# Patient Record
Sex: Male | Born: 1950 | Race: Black or African American | Hispanic: No | Marital: Married | Smoking: Former smoker
Health system: Southern US, Community
[De-identification: ages and names within clinical notes are randomized; demographics above are authoritative.]

## PROBLEM LIST (undated history)

## (undated) DIAGNOSIS — I10 Essential (primary) hypertension: Secondary | ICD-10-CM

---

## 1998-06-20 ENCOUNTER — Emergency Department (HOSPITAL_COMMUNITY): Admission: EM | Admit: 1998-06-20 | Discharge: 1998-06-20 | Payer: Self-pay | Admitting: Emergency Medicine

## 1998-06-23 ENCOUNTER — Emergency Department (HOSPITAL_COMMUNITY): Admission: EM | Admit: 1998-06-23 | Discharge: 1998-06-23 | Payer: Self-pay | Admitting: Emergency Medicine

## 2001-02-11 ENCOUNTER — Encounter: Admission: RE | Admit: 2001-02-11 | Discharge: 2001-02-11 | Payer: Self-pay | Admitting: Internal Medicine

## 2014-04-09 ENCOUNTER — Emergency Department (HOSPITAL_COMMUNITY): Payer: 59

## 2014-04-09 ENCOUNTER — Emergency Department (HOSPITAL_COMMUNITY)
Admission: EM | Admit: 2014-04-09 | Discharge: 2014-04-09 | Disposition: A | Discharge status: Home / Self Care | Payer: 59 | Attending: Emergency Medicine | Admitting: Emergency Medicine

## 2014-04-09 ENCOUNTER — Encounter (HOSPITAL_COMMUNITY): Payer: Self-pay | Admitting: Emergency Medicine

## 2014-04-09 DIAGNOSIS — Y92410 Unspecified street and highway as the place of occurrence of the external cause: Secondary | ICD-10-CM | POA: Diagnosis not present

## 2014-04-09 DIAGNOSIS — IMO0002 Reserved for concepts with insufficient information to code with codable children: Secondary | ICD-10-CM | POA: Diagnosis not present

## 2014-04-09 DIAGNOSIS — S0990XA Unspecified injury of head, initial encounter: Secondary | ICD-10-CM | POA: Insufficient documentation

## 2014-04-09 DIAGNOSIS — Z87891 Personal history of nicotine dependence: Secondary | ICD-10-CM | POA: Diagnosis not present

## 2014-04-09 DIAGNOSIS — I158 Other secondary hypertension: Secondary | ICD-10-CM | POA: Diagnosis not present

## 2014-04-09 DIAGNOSIS — Y9389 Activity, other specified: Secondary | ICD-10-CM | POA: Insufficient documentation

## 2014-04-09 DIAGNOSIS — I498 Other specified cardiac arrhythmias: Secondary | ICD-10-CM | POA: Insufficient documentation

## 2014-04-09 LAB — I-STAT CHEM 8, ED
BUN: 15 mg/dL (ref 6–23)
CHLORIDE: 101 meq/L (ref 96–112)
Calcium, Ion: 1.26 mmol/L (ref 1.13–1.30)
Creatinine, Ser: 1 mg/dL (ref 0.50–1.35)
GLUCOSE: 84 mg/dL (ref 70–99)
HCT: 46 % (ref 39.0–52.0)
Hemoglobin: 15.6 g/dL (ref 13.0–17.0)
Potassium: 4.3 mEq/L (ref 3.7–5.3)
Sodium: 141 mEq/L (ref 137–147)
TCO2: 27 mmol/L (ref 0–100)

## 2014-04-09 MED ORDER — HYDROCHLOROTHIAZIDE 12.5 MG PO CAPS
12.5000 mg | ORAL_CAPSULE | Freq: Once | ORAL | Status: AC
  Administered 2014-04-09: 12.5 mg via ORAL
  Filled 2014-04-09: qty 1

## 2014-04-09 MED ORDER — OXYCODONE-ACETAMINOPHEN 5-325 MG PO TABS
1.0000 | ORAL_TABLET | Freq: Once | ORAL | Status: AC
  Administered 2014-04-09: 1 via ORAL
  Filled 2014-04-09: qty 1

## 2014-04-09 MED ORDER — HYDROCODONE-ACETAMINOPHEN 5-325 MG PO TABS: 1.0000 | ORAL_TABLET | Freq: Four times a day (QID) | ORAL | Status: DC | PRN

## 2014-04-09 MED ORDER — METHOCARBAMOL 500 MG PO TABS: 500.0000 mg | ORAL_TABLET | Freq: Two times a day (BID) | ORAL | Status: AC

## 2014-04-09 MED ORDER — HYDROCHLOROTHIAZIDE 25 MG PO TABS: 25.0000 mg | ORAL_TABLET | Freq: Every day | ORAL | Status: AC
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
## 2014-04-09 NOTE — ED Notes (Addendum)
Pt was educated on the importance of taking rx'd BP meds by PA and following up with a PCP to have BP monitored. Pt and family verbalized understanding

## 2014-04-19 NOTE — ED Provider Notes (Signed)
Medical screening examination/treatment/procedure(s) were performed by non-physician practitioner and as supervising physician I was immediately available for consultation/collaboration.   EKG Interpretation None       Hurman Horn, MD 04/19/14 2032

## 2016-10-01 ENCOUNTER — Emergency Department (HOSPITAL_COMMUNITY)
Admission: EM | Admit: 2016-10-01 | Discharge: 2016-10-01 | Disposition: A | Discharge status: Home / Self Care | Payer: BLUE CROSS/BLUE SHIELD | Attending: Emergency Medicine | Admitting: Emergency Medicine

## 2016-10-01 ENCOUNTER — Encounter (HOSPITAL_COMMUNITY): Payer: Self-pay

## 2016-10-01 ENCOUNTER — Emergency Department (HOSPITAL_COMMUNITY): Payer: BLUE CROSS/BLUE SHIELD

## 2016-10-01 DIAGNOSIS — R791 Abnormal coagulation profile: Secondary | ICD-10-CM | POA: Diagnosis not present

## 2016-10-01 DIAGNOSIS — Z87891 Personal history of nicotine dependence: Secondary | ICD-10-CM | POA: Diagnosis not present

## 2016-10-01 DIAGNOSIS — I10 Essential (primary) hypertension: Secondary | ICD-10-CM | POA: Insufficient documentation

## 2016-10-01 DIAGNOSIS — R509 Fever, unspecified: Secondary | ICD-10-CM | POA: Insufficient documentation

## 2016-10-01 DIAGNOSIS — Z79899 Other long term (current) drug therapy: Secondary | ICD-10-CM | POA: Diagnosis not present

## 2016-10-01 DIAGNOSIS — B029 Zoster without complications: Secondary | ICD-10-CM | POA: Diagnosis present

## 2016-10-01 HISTORY — DX: Essential (primary) hypertension: I10

## 2016-10-01 LAB — CBC WITH DIFFERENTIAL/PLATELET
Basophils Absolute: 0.1 10*3/uL (ref 0.0–0.1)
Basophils Relative: 2 %
Eosinophils Absolute: 0 10*3/uL (ref 0.0–0.7)
Eosinophils Relative: 0 %
HCT: 37.9 % — ABNORMAL LOW (ref 39.0–52.0)
Hemoglobin: 12.7 g/dL — ABNORMAL LOW (ref 13.0–17.0)
LYMPHS ABS: 1 10*3/uL (ref 0.7–4.0)
LYMPHS PCT: 17 %
MCH: 33.7 pg (ref 26.0–34.0)
MCHC: 33.5 g/dL (ref 30.0–36.0)
MCV: 100.5 fL — AB (ref 78.0–100.0)
Monocytes Absolute: 1.6 10*3/uL — ABNORMAL HIGH (ref 0.1–1.0)
Monocytes Relative: 28 %
NEUTROS ABS: 3 10*3/uL (ref 1.7–7.7)
Neutrophils Relative %: 53 %
Platelets: 157 10*3/uL (ref 150–400)
RBC: 3.77 MIL/uL — AB (ref 4.22–5.81)
RDW: 14.9 % (ref 11.5–15.5)
WBC: 5.7 10*3/uL (ref 4.0–10.5)

## 2016-10-01 LAB — URINALYSIS, ROUTINE W REFLEX MICROSCOPIC
Bilirubin Urine: NEGATIVE
GLUCOSE, UA: NEGATIVE mg/dL
HGB URINE DIPSTICK: NEGATIVE
Ketones, ur: NEGATIVE mg/dL
Leukocytes, UA: NEGATIVE
NITRITE: NEGATIVE
Protein, ur: 30 mg/dL — AB
Specific Gravity, Urine: 1.02 (ref 1.005–1.030)
pH: 7 (ref 5.0–8.0)

## 2016-10-01 LAB — COMPREHENSIVE METABOLIC PANEL
ALBUMIN: 3.7 g/dL (ref 3.5–5.0)
ALK PHOS: 76 U/L (ref 38–126)
ALT: 125 U/L — ABNORMAL HIGH (ref 17–63)
AST: 148 U/L — ABNORMAL HIGH (ref 15–41)
Anion gap: 11 (ref 5–15)
BUN: 10 mg/dL (ref 6–20)
CO2: 27 mmol/L (ref 22–32)
Calcium: 8.8 mg/dL — ABNORMAL LOW (ref 8.9–10.3)
Chloride: 98 mmol/L — ABNORMAL LOW (ref 101–111)
Creatinine, Ser: 1.58 mg/dL — ABNORMAL HIGH (ref 0.61–1.24)
GFR calc non Af Amer: 45 mL/min — ABNORMAL LOW (ref >60)
GFR, EST AFRICAN AMERICAN: 52 mL/min — AB (ref >60)
GLUCOSE: 128 mg/dL — AB (ref 65–99)
POTASSIUM: 3.2 mmol/L — AB (ref 3.5–5.1)
SODIUM: 136 mmol/L (ref 135–145)
Total Bilirubin: 1 mg/dL (ref 0.3–1.2)
Total Protein: 7 g/dL (ref 6.5–8.1)

## 2016-10-01 LAB — I-STAT CG4 LACTIC ACID, ED
Lactic Acid, Venous: 2.58 mmol/L (ref 0.5–1.9)
Lactic Acid, Venous: 2.01 mmol/L (ref 0.5–1.9)

## 2016-10-01 LAB — PROTIME-INR
INR: 1.11
PROTHROMBIN TIME: 14.4 s (ref 11.4–15.2)

## 2016-10-01 MED ORDER — FENTANYL CITRATE (PF) 100 MCG/2ML IJ SOLN
50.0000 ug | Freq: Once | INTRAMUSCULAR | Status: AC
  Administered 2016-10-01: 50 ug via INTRAVENOUS
  Filled 2016-10-01: qty 2

## 2016-10-01 MED ORDER — VALACYCLOVIR HCL 1 G PO TABS: 1000.0000 mg | ORAL_TABLET | Freq: Three times a day (TID) | ORAL | 0 refills | Status: AC

## 2016-10-01 MED ORDER — SODIUM CHLORIDE 0.9 % IV BOLUS (SEPSIS): 1000.0000 mL | Freq: Once | INTRAVENOUS | Status: DC

## 2016-10-01 MED ORDER — VALACYCLOVIR HCL 500 MG PO TABS
1000.0000 mg | ORAL_TABLET | Freq: Once | ORAL | Status: AC
  Administered 2016-10-01: 1000 mg via ORAL
  Filled 2016-10-01: qty 2

## 2016-10-01 MED ORDER — HYDROCODONE-ACETAMINOPHEN 5-325 MG PO TABS: 1.0000 | ORAL_TABLET | Freq: Four times a day (QID) | ORAL | 0 refills | Status: DC | PRN

## 2016-10-01 MED ORDER — SODIUM CHLORIDE 0.9 % IV BOLUS (SEPSIS)
1000.0000 mL | Freq: Once | INTRAVENOUS | Status: AC
  Administered 2016-10-01: 1000 mL via INTRAVENOUS

## 2016-10-01 NOTE — ED Provider Notes (Signed)
[facility]-EMERGENCY DEPT Provider Note   CSN: [id number] Arrival date & time: 10/01/16  1408     History   Chief Complaint Chief Complaint  Patient presents with  . Herpes Zoster  . Fever    HPI Alexander Zuniga is a 66 y.o. male.  HPI  Patient presents with concern generalized discomfort, fever, pain focally in the right face, neck. Illness began 4 days ago. Since onset illness has been progressive, though the patient had only subjective fever, mild pain initially. Today the patient went to his physician's office, was diagnosed with shingles, sent here after he was found to be febrile, and complained of generalized discomfort. Patient notes that he has some improvement after receiving a medication at his physician's office.  Patient denies any visual changes, difficulty swallowing, speaking, breathing, any olfactory changes, hearing changes. He denies nausea, acknowledges mild anorexia. No weakness anywhere, no numbness anywhere. No cough.   Past Medical History:  Diagnosis Date  . Hypertension     There are no active problems to display for this patient.   History reviewed. No pertinent surgical history.     Home Medications    Prior to Admission medications   Medication Sig Start Date End Date Taking? Authorizing Provider  hydrochlorothiazide (HYDRODIURIL) 25 MG tablet Take 1 tablet (25 mg total) by mouth daily. 04/09/14   Fayrene Helper, PA-C  HYDROcodone-acetaminophen (NORCO/VICODIN) 5-325 MG per tablet Take 1 tablet by mouth every 6 (six) hours as needed for moderate pain or severe pain. 04/09/14   Fayrene Helper, PA-C  methocarbamol (ROBAXIN) 500 MG tablet Take 1 tablet (500 mg total) by mouth 2 (two) times daily. 04/09/14   Fayrene Helper, PA-C    Family History History reviewed. No pertinent family history.  Social History Social History  Substance Use Topics  . Smoking status: Former Smoker    Quit date: 10/01/1976  . Smokeless tobacco: Never Used  . Alcohol use No      Allergies   Patient has no known allergies.   Review of Systems Review of Systems  Constitutional:       Per HPI, otherwise negative  HENT:       Per HPI, otherwise negative  Respiratory:       Per HPI, otherwise negative  Cardiovascular:       Per HPI, otherwise negative  Gastrointestinal: Negative for vomiting.  Endocrine:       Negative aside from HPI  Genitourinary:       Neg aside from HPI   Musculoskeletal:       Per HPI, otherwise negative  Skin: Positive for rash.  Allergic/Immunologic: Negative for immunocompromised state.  Neurological: Negative for syncope.     Physical Exam Updated Vital Signs BP 168/99   Pulse (!) 59   Temp 100.7 F (38.2 C) (Oral)   Resp 15   Ht 6' (1.829 m)   Wt 177 lb (80.3 kg)   SpO2 100%   BMI 24.01 kg/m   Physical Exam  Constitutional: He is oriented to person, place, and time. He appears well-developed. No distress.  HENT:  Head: Atraumatic.    Mouth/Throat: Uvula is midline, oropharynx is clear and moist and mucous membranes are normal.  Eyes: Conjunctivae and EOM are normal.  Cardiovascular: Normal rate and regular rhythm.   Pulmonary/Chest: Effort normal. No stridor. No respiratory distress.  Abdominal: He exhibits no distension.  Musculoskeletal: He exhibits no edema.  Neurological: He is alert and oriented to person, place, and time. He displays no  atrophy. No cranial nerve deficit. He exhibits normal muscle tone. He displays no seizure activity.  Pain throughout the R face - ~v2 distribution.  Skin: Skin is warm and dry.  Psychiatric: He has a normal mood and affect.  Nursing note and vitals reviewed.    ED Treatments / Results  Labs (all labs ordered are listed, but only abnormal results are displayed) Labs Reviewed  COMPREHENSIVE METABOLIC PANEL - Abnormal; Notable for the following:       Result Value   Potassium 3.2 (*)    Chloride 98 (*)    Glucose, Bld 128 (*)    Creatinine, Ser 1.58 (*)     Calcium 8.8 (*)    AST 148 (*)    ALT 125 (*)    GFR calc non Af Amer 45 (*)    GFR calc Af Amer 52 (*)    All other components within normal limits  CBC WITH DIFFERENTIAL/PLATELET - Abnormal; Notable for the following:    RBC 3.77 (*)    Hemoglobin 12.7 (*)    HCT 37.9 (*)    MCV 100.5 (*)    Monocytes Absolute 1.6 (*)    All other components within normal limits  URINALYSIS, ROUTINE W REFLEX MICROSCOPIC - Abnormal; Notable for the following:    Color, Urine AMBER (*)    Protein, ur 30 (*)    Bacteria, UA RARE (*)    Squamous Epithelial / LPF 0-5 (*)    All other components within normal limits  I-STAT CG4 LACTIC ACID, ED - Abnormal; Notable for the following:    Lactic Acid, Venous 2.58 (*)    All other components within normal limits  I-STAT CG4 LACTIC ACID, ED - Abnormal; Notable for the following:    Lactic Acid, Venous 2.01 (*)    All other components within normal limits  PROTIME-INR  I-STAT CG4 LACTIC ACID, ED    Radiology Dg Chest Portable 1 View  Result Date: 10/01/2016 CLINICAL DATA:  Generalized weakness with fever EXAM: PORTABLE CHEST 1 VIEW COMPARISON:  None. FINDINGS: The heart size and mediastinal contours are within normal limits. Both lungs are clear. The visualized skeletal structures are unremarkable. IMPRESSION: No active disease. Electronically Signed   By: Alcide Clever M.D.   On: 10/01/2016 15:41    Procedures Procedures (including critical care time)  Medications Ordered in ED Medications  valACYclovir (VALTREX) tablet 1,000 mg (not administered)  sodium chloride 0.9 % bolus 1,000 mL (not administered)  sodium chloride 0.9 % bolus 1,000 mL (1,000 mLs Intravenous New Bag/Given 10/01/16 1449)   Note from the physician's office voices concern for possible sepsis given the patient's fever, but patient's initial vital signs are noticeable for hypertension, no tachycardia, no tachypnea, no substantial fever.   5:15 PM LA decreased to essentially  normal.  BP nl and I discussed all findings on repeat evaluation of the patient per Patient's wife now present, she is aware as well.  Initial Impression / Assessment and Plan / ED Course  I have reviewed the triage vital signs and the nursing notes.  Pertinent labs & imaging results that were available during my care of the patient were reviewed by me and considered in my medical decision making (see chart for details).  Patient presents with 4 days of cutaneous changes, generalized illness, and ongoing pain. Patient was sent here for concern of possible sepsis by his primary care physician, but there is no evidence for that today. Patient is a mild lactic acidosis,  mild elevation creatinine, both more consistent with dehydration, ongoing inflammatory condition. Patient is not hypotensive, tachycardic, has no leukocytosis. After 2 L fluid resuscitation patient had improvement in his lactic acid level.  Subsequently, he was discharged with antivirals, analgesics.   Gerhard Munchobert Arine Foley, MD 10/01/16 [phone]

## 2016-10-01 NOTE — ED Triage Notes (Addendum)
Per EMS - pt went to PCP today for generalized weakness x 5 days. Pt found to have temp 102F, dx w/ shingles on right side of ear and neck. PCP wanted pt to have further workup. Given 1000mg  Tylenol by PCP, 1L NS.

## 2017-09-11 ENCOUNTER — Other Ambulatory Visit: Payer: Self-pay

## 2017-09-11 ENCOUNTER — Emergency Department (HOSPITAL_COMMUNITY): Payer: BLUE CROSS/BLUE SHIELD

## 2017-09-11 ENCOUNTER — Emergency Department (HOSPITAL_COMMUNITY)
Admission: EM | Admit: 2017-09-11 | Discharge: 2017-09-11 | Disposition: A | Discharge status: Home / Self Care | Payer: BLUE CROSS/BLUE SHIELD | Attending: Emergency Medicine | Admitting: Emergency Medicine

## 2017-09-11 ENCOUNTER — Encounter (HOSPITAL_COMMUNITY): Payer: Self-pay | Admitting: Emergency Medicine

## 2017-09-11 DIAGNOSIS — M79641 Pain in right hand: Secondary | ICD-10-CM | POA: Diagnosis present

## 2017-09-11 DIAGNOSIS — Z79899 Other long term (current) drug therapy: Secondary | ICD-10-CM | POA: Diagnosis not present

## 2017-09-11 DIAGNOSIS — M10041 Idiopathic gout, right hand: Secondary | ICD-10-CM | POA: Diagnosis not present

## 2017-09-11 DIAGNOSIS — I10 Essential (primary) hypertension: Secondary | ICD-10-CM | POA: Diagnosis not present

## 2017-09-11 DIAGNOSIS — R030 Elevated blood-pressure reading, without diagnosis of hypertension: Secondary | ICD-10-CM

## 2017-09-11 DIAGNOSIS — Z87891 Personal history of nicotine dependence: Secondary | ICD-10-CM | POA: Diagnosis not present

## 2017-09-11 MED ORDER — INDOMETHACIN 50 MG PO CAPS: 50.0000 mg | ORAL_CAPSULE | Freq: Three times a day (TID) | ORAL | 0 refills | Status: AC

## 2017-09-11 MED ORDER — INDOMETHACIN 25 MG PO CAPS
50.0000 mg | ORAL_CAPSULE | Freq: Once | ORAL | Status: AC
  Administered 2017-09-11: 50 mg via ORAL
  Filled 2017-09-11: qty 2

## 2017-09-11 MED ORDER — HYDROCODONE-ACETAMINOPHEN 5-325 MG PO TABS: ORAL_TABLET | ORAL | 0 refills | Status: DC

## 2017-09-11 NOTE — Discharge Instructions (Signed)
Take vicodin for breakthrough pain, do not drink alcohol, drive, care for children or do other critical tasks while taking vicodin. ° °Please follow with your primary care doctor in the next 2 days for a check-up. They must obtain records for further management.  ° °Do not hesitate to return to the Emergency Department for any new, worsening or concerning symptoms.  ° °

## 2017-09-11 NOTE — ED Notes (Signed)
Lab work, radiology results and vital signs reviewed, no critical results at this time, no change in acuity indicated.  

## 2017-09-11 NOTE — ED Triage Notes (Addendum)
Pt c/o right hand and finger pain/swelling that started on Wednesday. Unsure of injury. Pt hypertensive in triage, hx of states he takes medications as prescribed.

## 2017-09-11 NOTE — ED Provider Notes (Signed)
[facility] EMERGENCY DEPARTMENT Provider Note   CSN: [id number] Arrival date & time: 09/11/17  1602     History   Chief Complaint Chief Complaint  Patient presents with  . Hand Pain    HPI   Blood pressure (!) 206/94, pulse (!) 44, temperature 99.1 F (37.3 C), temperature source Oral, resp. rate 16, height 6' (1.829 m), weight 86.2 kg (190 lb), SpO2 100 %.  Alexander Zuniga is a 67 y.o. male complaining of atraumatic right hand pain and swelling worsening over the course of the last several days.  Patient is right-hand dominant.  States that the pain is severe and exacerbated by movement and palpation.  He does not have gout, he states he has been taking his medications as prescribed.  Takes hydrochlorothiazide.  He denies any chest pain, shortness of breath, headache, change in vision, nausea vomiting, dysarthria or ataxia.  Past Medical History:  Diagnosis Date  . Hypertension     There are no active problems to display for this patient.   History reviewed. No pertinent surgical history.     Home Medications    Prior to Admission medications   Medication Sig Start Date End Date Taking? Authorizing Provider  hydrochlorothiazide (HYDRODIURIL) 25 MG tablet Take 1 tablet (25 mg total) by mouth daily. 04/09/14   Fayrene Helper, PA-C  HYDROcodone-acetaminophen (NORCO/VICODIN) 5-325 MG tablet Take 1-2 tablets by mouth every 6 hours as needed for pain and/or cough. 09/11/17   Airiel Oblinger, Joni Reining, PA-C  indomethacin (INDOCIN) 50 MG capsule Take 1 capsule (50 mg total) by mouth 3 (three) times daily with meals. 09/11/17   Abbegail Matuska, Joni Reining, PA-C  methocarbamol (ROBAXIN) 500 MG tablet Take 1 tablet (500 mg total) by mouth 2 (two) times daily. 04/09/14   Fayrene Helper, PA-C    Family History No family history on file.  Social History Social History   Tobacco Use  . Smoking status: Former Smoker    Last attempt to quit: 10/01/1976    Years since quitting: 40.9  .  Smokeless tobacco: Never Used  Substance Use Topics  . Alcohol use: No  . Drug use: No     Allergies   Patient has no known allergies.   Review of Systems Review of Systems  A complete review of systems was obtained and all systems are negative except as noted in the HPI and PMH.   Physical Exam Updated Vital Signs BP (!) 206/94   Pulse (!) 44   Temp 99.1 F (37.3 C) (Oral)   Resp 16   Ht 6' (1.829 m)   Wt 86.2 kg (190 lb)   SpO2 100%   BMI 25.77 kg/m   Physical Exam  Constitutional: He is oriented to person, place, and time. He appears well-developed and well-nourished. No distress.  HENT:  Head: Normocephalic and atraumatic.  Mouth/Throat: Oropharynx is clear and moist.  Eyes: Conjunctivae and EOM are normal. Pupils are equal, round, and reactive to light.  Neck: Normal range of motion.  Cardiovascular: Normal rate, regular rhythm and intact distal pulses.  Pulmonary/Chest: Effort normal and breath sounds normal.  Abdominal: Soft. There is no tenderness.  Musculoskeletal: Normal range of motion. He exhibits edema and tenderness.  Edema and tenderness on the dorsum of the right hand MCP, no significant warmth.  Distally neurovascularly intact.  Neurological: He is alert and oriented to person, place, and time.  Skin: He is not diaphoretic.  Psychiatric: He has a normal mood and affect.  Nursing note and  vitals reviewed.    ED Treatments / Results  Labs (all labs ordered are listed, but only abnormal results are displayed) Labs Reviewed - No data to display  EKG  EKG Interpretation None       Radiology Dg Hand Complete Right  Result Date: 09/11/2017 CLINICAL DATA:  Swelling and pain in the right hand, most prominent in the third MCP joint region. No reported injury. EXAM: RIGHT HAND - COMPLETE 3+ VIEW COMPARISON:  None. FINDINGS: There is soft tissue swelling in the right hand at the level of the MCP joints. No fracture or dislocation. No suspicious  focal osseous lesions. 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 No evidence of erosive arthropathy. No radiopaque foreign body. IMPRESSION: Soft tissue swelling in the right hand at the level of the MCP joints. No fracture or dislocation. 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 Electronically Signed   By: Delbert PhenixJason A Poff M.D.   On: 09/11/2017 18:10    Procedures Procedures (including critical care time)  Medications Ordered in ED Medications  indomethacin (INDOCIN) capsule 50 mg (50 mg Oral Given 09/11/17 1956)     Initial Impression / Assessment and Plan / ED Course  I have reviewed the triage vital signs and the nursing notes.  Pertinent labs & imaging results that were available during my care of the patient were reviewed by me and considered in my medical decision making (see chart for details).     Vitals:   09/11/17 1637 09/11/17 1643 09/11/17 1925 09/11/17 1930  BP: (!) 191/110  (!) 193/91 (!) 206/94  Pulse: 61  (!) 50 (!) 44  Resp: 16  16   Temp: 99.1 F (37.3 C)     TempSrc: Oral     SpO2: 99%  99% 100%  Weight:  86.2 kg (190 lb)    Height:  6' (1.829 m)      Medications  indomethacin (INDOCIN) capsule 50 mg (50 mg Oral Given 09/11/17 1956)    Alexander Zuniga is 67 y.o. male presenting with atraumatic right hand pain, x-ray negative, no significant warmth.  I think this is likely gout.  Patient does take hydrochlorothiazide.  Blood pressure poorly controlled, I have advised him to discuss with primary care if alteration to his hypertension medications is warranted.  Patient with no signs of endorgan damage or hypertensive emergency blood pressure likely elevated secondary to pain.  Patient bradycardic, chart review shows he has been bradycardic in the past.  Patient given indomethacin in the ED, work note provided, Vicodin for breakthrough pain.  Evaluation does not show pathology that would require ongoing emergent intervention or  inpatient treatment. Pt is hemodynamically stable and mentating appropriately. Discussed findings and plan with patient/guardian, who agrees with care plan. All questions answered. Return precautions discussed and outpatient follow up given.      Final Clinical Impressions(s) / ED Diagnoses   Final diagnoses:  Acute idiopathic gout of right hand  Elevated blood pressure reading    ED Discharge Orders        Ordered    indomethacin (INDOCIN) 50 MG capsule  3 times daily with meals     09/11/17 1951    HYDROcodone-acetaminophen (NORCO/VICODIN) 5-325 MG tablet     09/11/17 1951       Karas Pickerill, Mardella Laymanicole, PA-C 09/11/17 2011    Arby BarrettePfeiffer, Marcy, MD 09/16/17 1740

## 2017-09-11 NOTE — ED Notes (Signed)
Patient has history of hypertension.  He takes meds for it but has been advised to make an appointment with PCP to see if dosage or medication needs to be changed due to increased BP.

## 2020-11-23 ENCOUNTER — Emergency Department (HOSPITAL_COMMUNITY): Payer: Medicare Other

## 2020-11-23 ENCOUNTER — Other Ambulatory Visit: Payer: Self-pay

## 2020-11-23 ENCOUNTER — Emergency Department (HOSPITAL_COMMUNITY)
Admission: EM | Admit: 2020-11-23 | Discharge: 2020-11-23 | Disposition: A | Discharge status: Home / Self Care | Payer: Medicare Other | Attending: Emergency Medicine | Admitting: Emergency Medicine

## 2020-11-23 DIAGNOSIS — R0789 Other chest pain: Secondary | ICD-10-CM | POA: Diagnosis present

## 2020-11-23 DIAGNOSIS — I10 Essential (primary) hypertension: Secondary | ICD-10-CM | POA: Diagnosis not present

## 2020-11-23 DIAGNOSIS — R410 Disorientation, unspecified: Secondary | ICD-10-CM | POA: Insufficient documentation

## 2020-11-23 DIAGNOSIS — R001 Bradycardia, unspecified: Secondary | ICD-10-CM | POA: Diagnosis not present

## 2020-11-23 DIAGNOSIS — Z79899 Other long term (current) drug therapy: Secondary | ICD-10-CM | POA: Insufficient documentation

## 2020-11-23 DIAGNOSIS — Z20822 Contact with and (suspected) exposure to covid-19: Secondary | ICD-10-CM | POA: Diagnosis not present

## 2020-11-23 DIAGNOSIS — Z87891 Personal history of nicotine dependence: Secondary | ICD-10-CM | POA: Insufficient documentation

## 2020-11-23 LAB — CBC
HCT: 46.9 % (ref 39.0–52.0)
Hemoglobin: 14.7 g/dL (ref 13.0–17.0)
MCH: 30 pg (ref 26.0–34.0)
MCHC: 31.3 g/dL (ref 30.0–36.0)
MCV: 95.7 fL (ref 80.0–100.0)
Platelets: 197 10*3/uL (ref 150–400)
RBC: 4.9 MIL/uL (ref 4.22–5.81)
RDW: 15.1 % (ref 11.5–15.5)
WBC: 8 10*3/uL (ref 4.0–10.5)
nRBC: 0 % (ref 0.0–0.2)

## 2020-11-23 LAB — BASIC METABOLIC PANEL
Anion gap: 5 (ref 5–15)
BUN: 10 mg/dL (ref 8–23)
CO2: 29 mmol/L (ref 22–32)
Calcium: 9.4 mg/dL (ref 8.9–10.3)
Chloride: 105 mmol/L (ref 98–111)
Creatinine, Ser: 1.2 mg/dL (ref 0.61–1.24)
GFR, Estimated: >60 mL/min (ref >60)
Glucose, Bld: 102 mg/dL — ABNORMAL HIGH (ref 70–99)
Potassium: 4.5 mmol/L (ref 3.5–5.1)
Sodium: 139 mmol/L (ref 135–145)

## 2020-11-23 LAB — SARS CORONAVIRUS 2 (TAT 6-24 HRS): SARS Coronavirus 2: NEGATIVE

## 2020-11-23 LAB — TROPONIN I (HIGH SENSITIVITY)
Troponin I (High Sensitivity): 10 ng/L (ref <18)
Troponin I (High Sensitivity): 8 ng/L (ref <18)

## 2020-11-23 MED ORDER — DICLOFENAC EPOLAMINE 1.3 % EX PTCH
1.0000 | MEDICATED_PATCH | Freq: Two times a day (BID) | CUTANEOUS | Status: DC
  Administered 2020-11-23: 1 via TRANSDERMAL
  Filled 2020-11-23 (×2): qty 1

## 2020-11-23 NOTE — ED Notes (Signed)
All appropriate discharge materials reviewed at length with patient. Time for questions provided. Pt has no other questions at this time and verbalizes understanding of all provided materials.  

## 2020-11-23 NOTE — ED Provider Notes (Signed)
  Provider Note MRN:  [id number]  Arrival date & time: 11/23/20    ED Course and Medical Decision Making  Assumed care from Dr. Jeraldine Loots at shift change.  Chest pain felt to be noncardiac, awaiting second troponin.  On my evaluation in no acute distress, no pain.  Second troponin is negative, appropriate for discharge.  Procedures  Final Clinical Impressions(s) / ED Diagnoses     ICD-10-CM   1. Atypical chest pain  R07.89   2. Confusion  R41.0   3. Symptomatic bradycardia  R00.1     ED Discharge Orders    None        Discharge Instructions     You were evaluated in the Emergency Department and after careful evaluation, we did not find any emergent condition requiring admission or further testing in the hospital.  Your exam/testing today was overall reassuring.  Please return to the Emergency Department if you experience any worsening of your condition.  Thank you for allowing [place] to be a part of your care.      Elmer Sow. Pilar Plate, MD [facility] Emergency Medicine [facility] [email or web address]    Sabas Sous, MD 11/23/20 [phone]

## 2020-11-23 NOTE — ED Triage Notes (Signed)
Pt reports R sided chest pain with radiation around to back since being at work yesterday, worse this morning when he got up. Sts he does some heavy lifting at work and perhaps injured it then. Denies shob, n/v.

## 2020-11-23 NOTE — ED Provider Notes (Signed)
[facility] EMERGENCY DEPARTMENT Provider Note   CSN: [id number] Arrival date & time: 11/23/20  1013     History Chief Complaint  Patient presents with  . Chest Pain    Alexander Zuniga is a 70 y.o. male.  HPI Patient presents with concern for right-sided chest pain.  Pain is sore, focal, nonradiating.  He notes 1 prior episode about 5 years ago, but none in the interim.  Today, he noticed onset of pain in the right lateral chest.  No dyspnea, no syncope.  Pain seemingly is worse with action, not necessarily with inspiration. No fever, nausea, vomiting, no abdominal pain.  He notes a history of hypertension, states that he takes his medication regularly. He has suspicion that he may have injured himself moving things at work.  He works in a [employer], product/process development. Patient is a former smoker, though he stopped smoking in 1978. Pain is better at rest, negligible currently.    Past Medical History:  Diagnosis Date  . Hypertension     There are no problems to display for this patient.   No past surgical history on file.     No family history on file.  Social History   Tobacco Use  . Smoking status: Former Smoker    Quit date: 10/01/1976    Years since quitting: 44.1  . Smokeless tobacco: Never Used  Substance Use Topics  . Alcohol use: No  . Drug use: No    Home Medications Prior to Admission medications   Medication Sig Start Date End Date Taking? Authorizing Provider  amLODipine (NORVASC) 10 MG tablet Take 1 tablet by mouth daily. 11/18/20  Yes [provider]  gabapentin (NEURONTIN) 300 MG capsule Take 300 mg by mouth at bedtime as needed (pain).   Yes [provider]  hydrochlorothiazide (HYDRODIURIL) 25 MG tablet Take 1 tablet (25 mg total) by mouth daily. Patient not taking: Reported on 11/23/2020 04/09/14   Fayrene Helper, PA-C  indomethacin (INDOCIN) 50 MG capsule Take 1 capsule (50 mg total) by mouth 3 (three) times  daily with meals. Patient not taking: No sig reported 09/11/17   Pisciotta, Joni Reining, PA-C  methocarbamol (ROBAXIN) 500 MG tablet Take 1 tablet (500 mg total) by mouth 2 (two) times daily. Patient not taking: Reported on 11/23/2020 04/09/14   Fayrene Helper, PA-C    Allergies    Patient has no known allergies.  Review of Systems   Review of Systems  Constitutional:       Per HPI, otherwise negative  HENT:       Per HPI, otherwise negative  Respiratory:       Per HPI, otherwise negative  Cardiovascular:       Per HPI, otherwise negative  Gastrointestinal: Negative for vomiting.  Endocrine:       Negative aside from HPI  Genitourinary:       Neg aside from HPI   Musculoskeletal:       Per HPI, otherwise negative  Skin: Negative for rash.  Neurological: Negative for syncope.    Physical Exam Updated Vital Signs BP (!) 145/74   Pulse (!) 39   Temp 98.2 F (36.8 C) (Oral)   Resp (!) 24   Ht 6' (1.829 m)   Wt 88.5 kg   SpO2 100%   BMI 26.45 kg/m   Physical Exam Vitals and nursing note reviewed.  Constitutional:      General: He is not in acute distress.    Appearance: He  is well-developed.  HENT:     Head: Normocephalic and atraumatic.  Eyes:     Conjunctiva/sclera: Conjunctivae normal.  Cardiovascular:     Rate and Rhythm: Normal rate and regular rhythm.  Pulmonary:     Effort: Pulmonary effort is normal. No respiratory distress.     Breath sounds: No stridor.  Chest:    Abdominal:     General: There is no distension.  Skin:    General: Skin is warm and dry.  Neurological:     Mental Status: He is alert and oriented to person, place, and time.     ED Results / Procedures / Treatments   Labs (all labs ordered are listed, but only abnormal results are displayed) Labs Reviewed  BASIC METABOLIC PANEL - Abnormal; Notable for the following components:      Result Value   Glucose, Bld 102 (*)    All other components within normal limits  SARS CORONAVIRUS 2 (TAT  6-24 HRS)  CBC  TROPONIN I (HIGH SENSITIVITY)  TROPONIN I (HIGH SENSITIVITY)    EKG EKG Interpretation  Date/Time:  Thursday November 23 2020 10:23:12 EDT Ventricular Rate:  45 PR Interval:  184 QRS Duration: 114 QT Interval:  420 QTC Calculation: 363 R Axis:   -10 Text Interpretation: Sinus bradycardia Incomplete right bundle branch block Moderate voltage criteria for LVH, may be normal variant ( R in aVL , Cornell product ) Possible Anterior infarct , age undetermined Abnormal ECG Confirmed by Gerhard Munch [id number]) on 11/23/2020 11:11:50 AM   Radiology DG Chest 2 View  Result Date: 11/23/2020 CLINICAL DATA:  70 year old male with right side chest pain radiating to the back since yesterday. EXAM: CHEST - 2 VIEW COMPARISON:  Portable chest 10/01/2016. FINDINGS: PA and lateral views. Tortuous thoracic aorta, not significantly changed from 2018. Other mediastinal contours are within normal limits. Visualized tracheal air column is within normal limits. Lung volumes are normal. No pneumothorax, pulmonary edema, pleural effusion or consolidation. Subtle asymmetric increased interstitial markings about the superior right hilum. Mild scoliosis. No acute osseous abnormality identified. Negative visible bowel gas pattern. IMPRESSION: 1. Subtle asymmetric increased interstitial markings about the superior right hilum new since 2018. This might be postinflammatory scarring. Acute infection or lung nodule not excluded. 2. Chronic tortuosity of the thoracic aorta is stable. Query chronic hypertension. 3. No other acute cardiopulmonary abnormality. Electronically Signed   By: Odessa Fleming M.D.   On: 11/23/2020 10:51   CT Head Wo Contrast  Result Date: 11/23/2020 CLINICAL DATA:  Mental status change. EXAM: CT HEAD WITHOUT CONTRAST TECHNIQUE: Contiguous axial images were obtained from the base of the skull through the vertex without intravenous contrast. COMPARISON:  April 09, 2014. FINDINGS: Brain: No evidence of  acute infarction, hemorrhage, hydrocephalus, extra-axial collection or mass lesion/mass effect. Progressive patchy white matter hypoattenuation, nonspecific but most likely related to chronic microvascular ischemic disease. Vascular: No hyperdense vessel identified. Skull: No acute fracture. Sinuses/Orbits: Mild inferior left maxillary sinus mucosal thickening. Unremarkable orbits. Other: No mastoid effusions. IMPRESSION: 1. No evidence of acute intracranial abnormality. 2. Progressive chronic microvascular ischemic disease. Electronically Signed   By: Feliberto Harts MD   On: 11/23/2020 16:09   CT Chest Wo Contrast  Result Date: 11/23/2020 CLINICAL DATA:  Abnormal chest radiograph with possible right pulmonary nodule EXAM: CT CHEST WITHOUT CONTRAST TECHNIQUE: Multidetector CT imaging of the chest was performed following the standard protocol without IV contrast. COMPARISON:  Chest radiograph from earlier today. FINDINGS: Cardiovascular: Normal heart size. No  significant pericardial effusion/thickening. Minimally atherosclerotic nonaneurysmal thoracic aorta. Normal caliber pulmonary arteries. Mediastinum/Nodes: No discrete thyroid nodules. Unremarkable esophagus. No pathologically enlarged axillary, mediastinal or hilar lymph nodes, noting limited sensitivity for the detection of hilar adenopathy on this noncontrast study. Lungs/Pleura: No pneumothorax. No pleural effusion. No acute consolidative airspace disease or lung masses. Two tiny right lower lobe solid pulmonary nodules, largest 3 mm peripherally (series 8/image 117). No additional significant pulmonary nodules. Upper abdomen: Simple 2.1 cm medial upper left renal cyst. Musculoskeletal: No aggressive appearing focal osseous lesions. Moderate thoracic spondylosis. IMPRESSION: 1. Two tiny solid right lower lobe pulmonary nodules, largest 3 mm, very likely benign. No follow-up needed if patient is low-risk (and has no known or suspected primary neoplasm).  Non-contrast chest CT can be considered in 12 months if patient is high-risk. This recommendation follows the consensus statement: Guidelines for Management of Incidental Pulmonary Nodules Detected on CT Images: From the Fleischner Society 2017; Radiology 2017; 284:228-243. 2. Aortic Atherosclerosis (ICD10-I70.0). Electronically Signed   By: Delbert Phenix M.D.   On: 11/23/2020 13:43    Procedures Procedures   Medications Ordered in ED Medications  diclofenac (FLECTOR) 1.3 % 1 patch (1 patch Transdermal Patch Applied 11/23/20 1436)    ED Course  I have reviewed the triage vital signs and the nursing notes.  Pertinent labs & imaging results that were available during my care of the patient were reviewed by me and considered in my medical decision making (see chart for details).   Cardiac monitor sinus, 40s, bradycardia, otherwise unremarkable Pulse ox 99% room air normal   Update: With consideration of abnormal x-ray, CT scan is ordered.  3:02 PM On repeat exam the patient is in no distress, his bradycardia is more pronounced, now regularly in the 40s, occasionally 50s, sinus bradycardia. On reviewing his meds, he is on Norvasc. He is now accompanied by his girlfriend. We discussed today's presentation.  She now notes that in addition to his right-sided chest pain, he has been confused to today, with repetitive statements.  Whereas he is typically awake, alert, interactive, he has been less so today with confusion as above.  On CT scan pending.  Adult male presents with right-sided chest pain.  Evaluation in terms of ACS, PE, pneumothorax all reassuring.  Patient does have some evidence for pulmonary nodules, but more concerning is the patient's bradycardia, confusion, concern for symptomatic bradycardia. Some consideration of this being medication related as he is on a calcium channel blocker.  On repeat exam the patient is in no distress.  He has no ongoing complaints.  Results thus far  are reassuring.  No CT e/o CVA, CT chest w nodules - patient and girlfriend aware.  Next day completion - remaining labs WNL.  Patient d/c in stable condition.  Final Clinical Impression(s) / ED Diagnoses Final diagnoses:  Atypical chest pain  Confusion  Symptomatic bradycardia     Gerhard Munch, MD 11/24/20 1455

## 2020-11-23 NOTE — Discharge Instructions (Addendum)
You were evaluated in the Emergency Department and after careful evaluation, we did not find any emergent condition requiring admission or further testing in the hospital.  Your exam/testing today was overall reassuring.  Please return to the Emergency Department if you experience any worsening of your condition.  Thank you for allowing us to be a part of your care.  

## 2022-03-14 ENCOUNTER — Encounter: Payer: Self-pay | Admitting: Physician Assistant

## 2022-03-19 IMAGING — CT CT HEAD W/O CM
3 series · 15 of 47 positions shown, 18 images · non-contrast
Comparison: April 09, 2014.

CLINICAL DATA: Mental status change.

EXAM:
CT HEAD WITHOUT CONTRAST
TECHNIQUE: Contiguous axial images were obtained from the base of the skull
through the vertex without intravenous contrast.

[Series 3: head 5.0 h30s · axial · 0.44mm/px · z∈[-150,-15]mm · 9 of 33 slices shown, 12 images]
[im 3/33  brain]
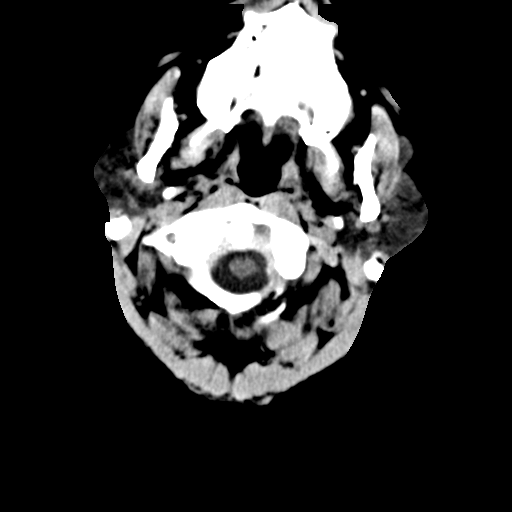
[im 3/33  bone]
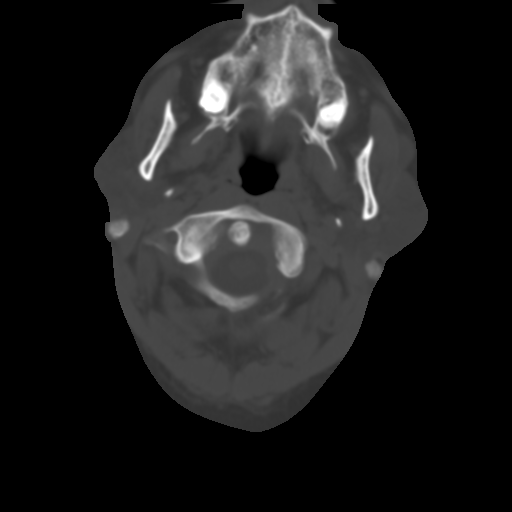
[im 6/33  brain]
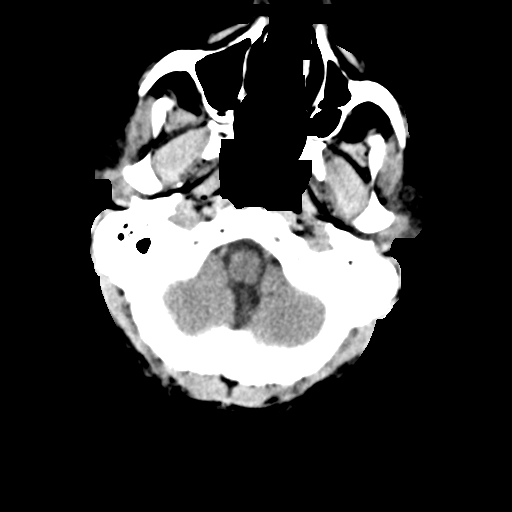
[im 9/33  brain]
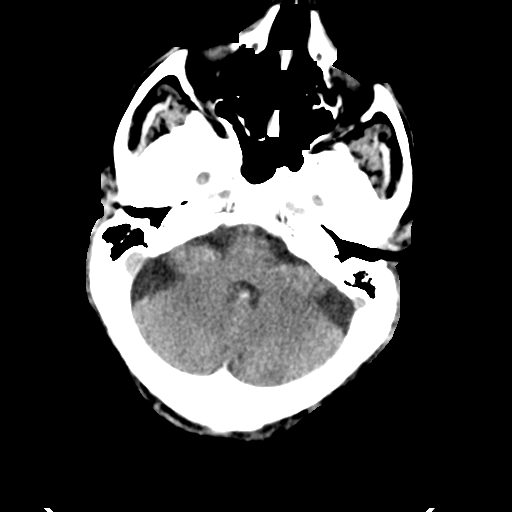
[im 13/33  brain]
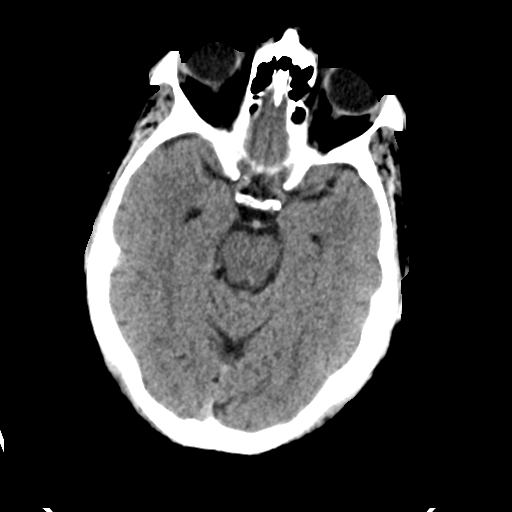
[im 17/33  brain]
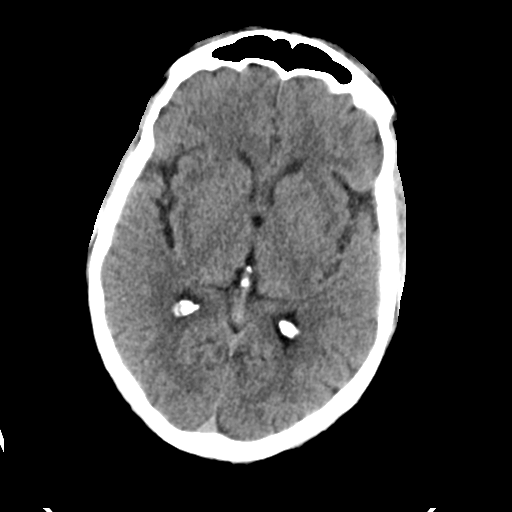
[im 17/33  bone]
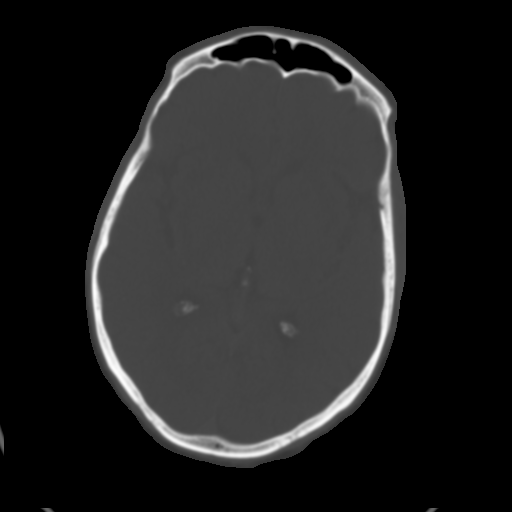
[im 20/33  brain]
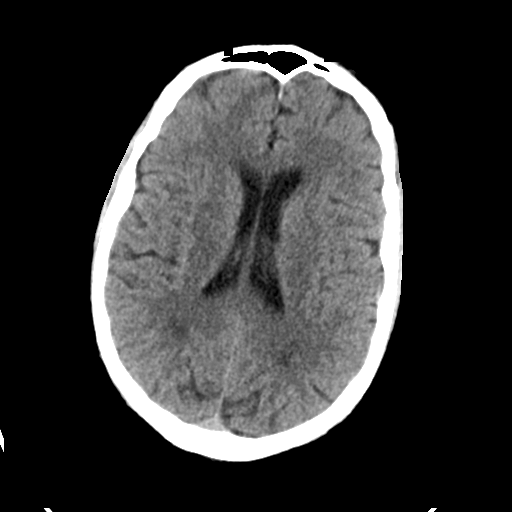
[im 24/33  brain]
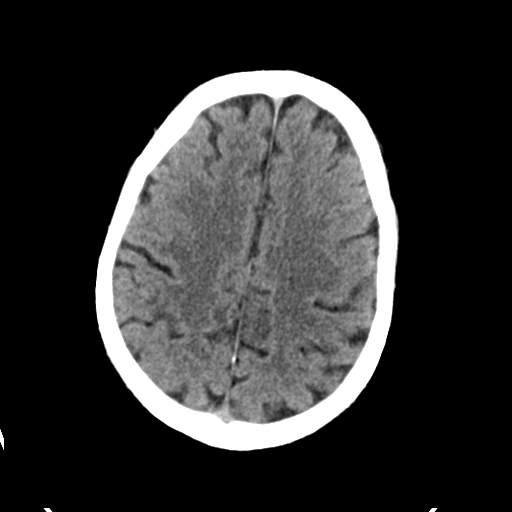
[im 27/33  brain]
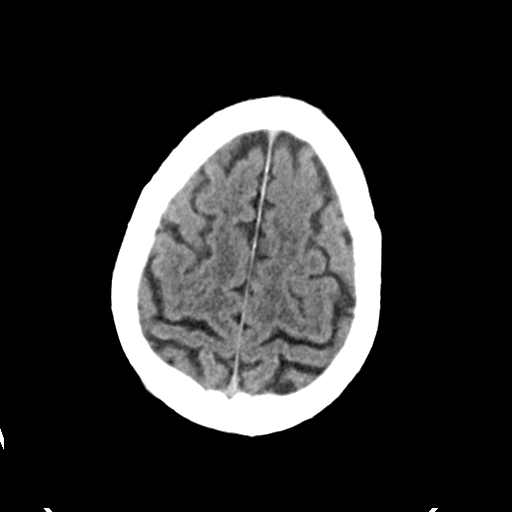
[im 30/33  brain]
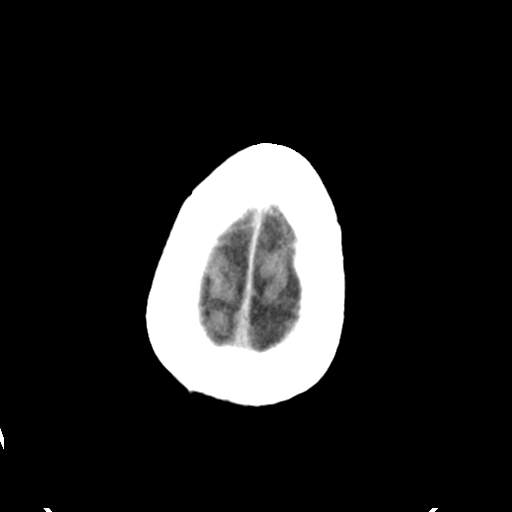
[im 30/33  bone]
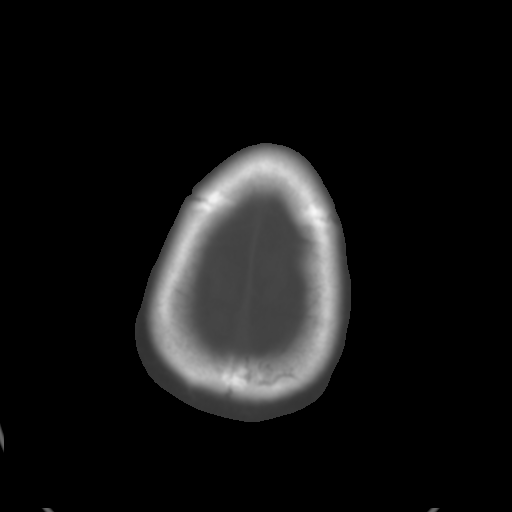

[Series 5: head 3.0 mpr cor · coronal · 0.34mm/px · 3 of 74 slices shown]
[im 25/74  brain]
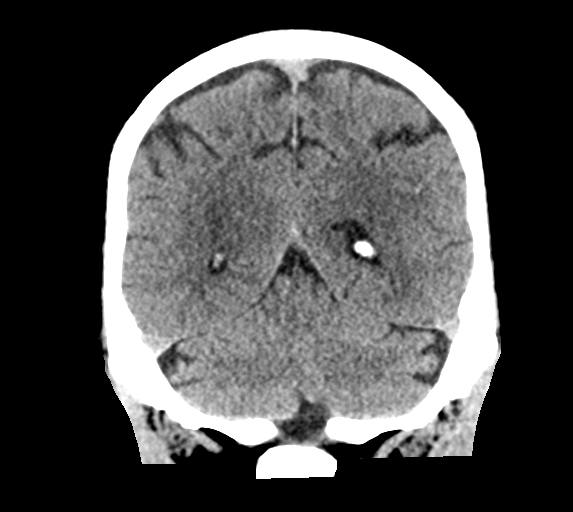
[im 33/74  brain]
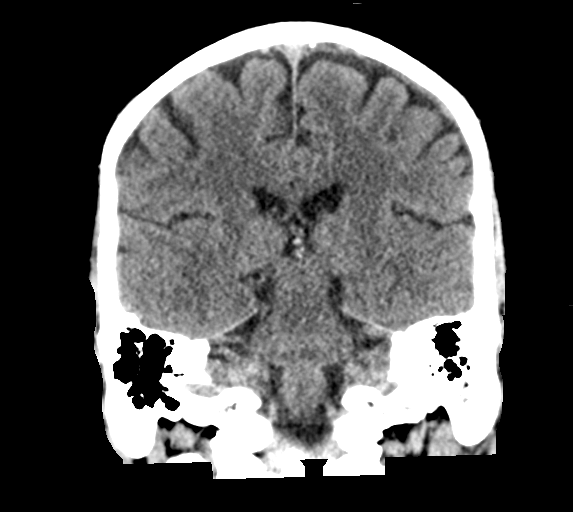
[im 41/74  brain]
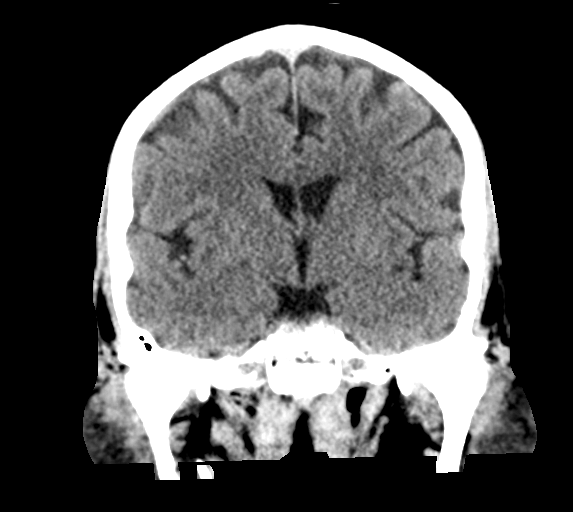

[Series 6: head 3.0 mpr sag · sagittal · 0.34mm/px · 3 of 58 slices shown]
[im 20/58  brain]
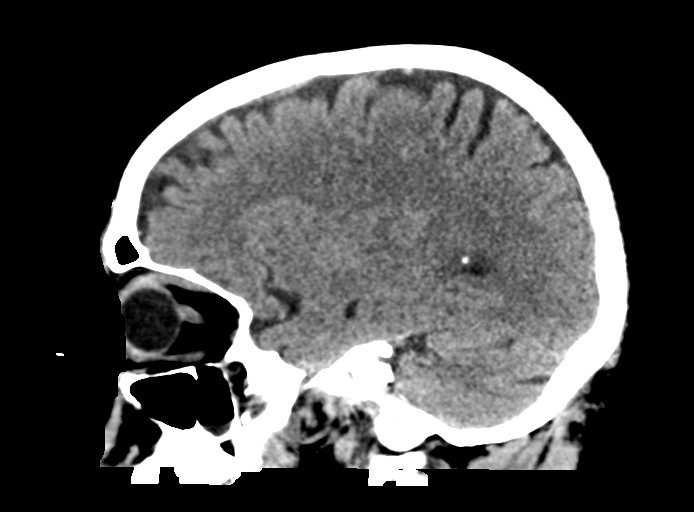
[im 29/58  brain]
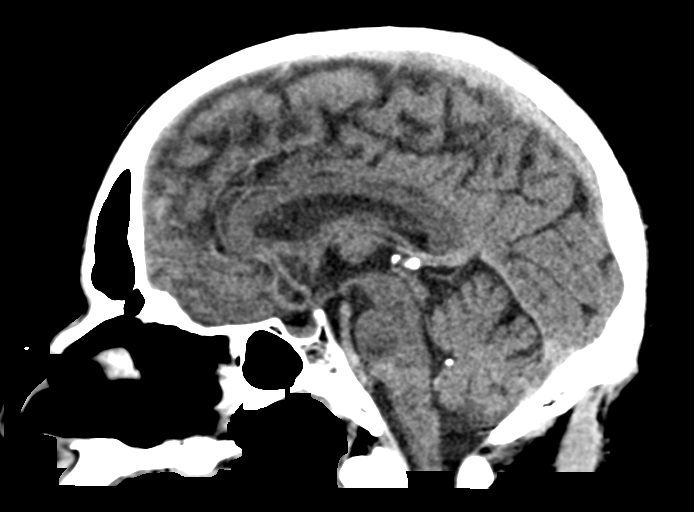
[im 39/58  brain]
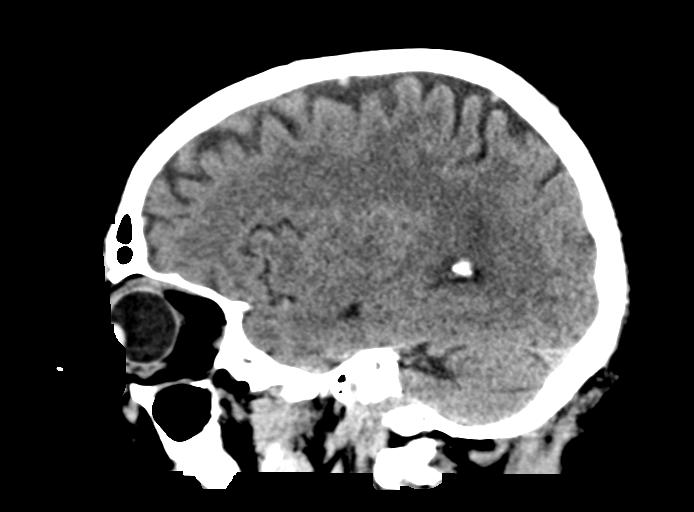

[15 of 47 positions shown; findings below may reference images not displayed]

FINDINGS: Brain: No evidence of acute infarction, hemorrhage, hydrocephalus,
extra-axial collection or mass lesion/mass effect. Progressive
patchy white matter hypoattenuation, nonspecific but most likely
related to chronic microvascular ischemic disease.

Vascular: No hyperdense vessel identified.

Skull: No acute fracture.

Sinuses/Orbits: Mild inferior left maxillary sinus mucosal
thickening. Unremarkable orbits.

Other: No mastoid effusions.
IMPRESSION: 1. No evidence of acute intracranial abnormality.
2. Progressive chronic microvascular ischemic disease.

## 2022-03-19 IMAGING — CT CT CHEST W/O CM
2 of 5 series · 13 of 36 positions shown, 16 images · non-contrast
Comparison: Chest radiograph from earlier today.

CLINICAL DATA: Abnormal chest radiograph with possible right
pulmonary nodule

EXAM:
CT CHEST WITHOUT CONTRAST
TECHNIQUE: Multidetector CT imaging of the chest was performed following the
standard protocol without IV contrast.

[Series 4: thorax 2.0 · axial · 0.80mm/px · z∈[+1089,+1345]mm · 10 of 158 slices shown, 13 images]
[im 15/158  mediastinal]
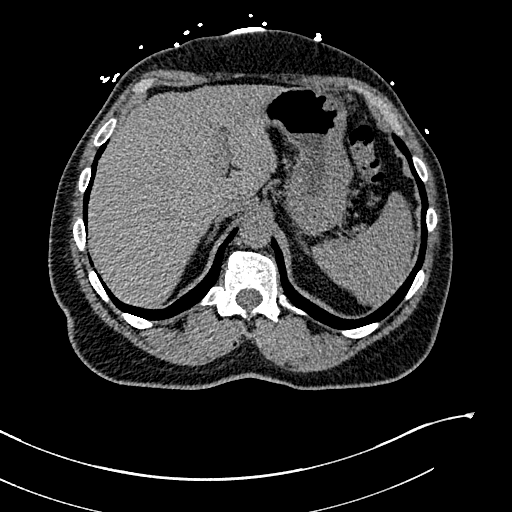
[im 15/158  lung]
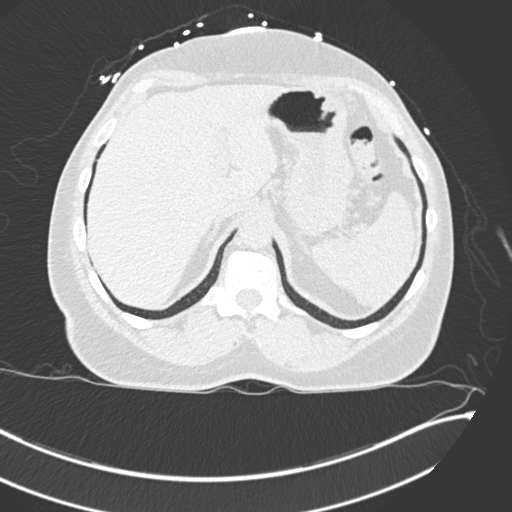
[im 29/158  lung]
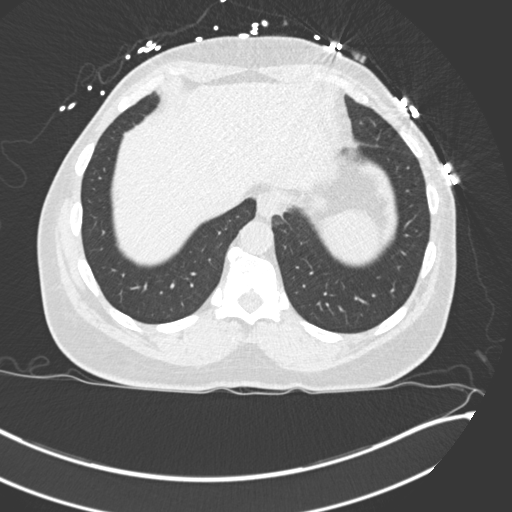
[im 43/158  lung]
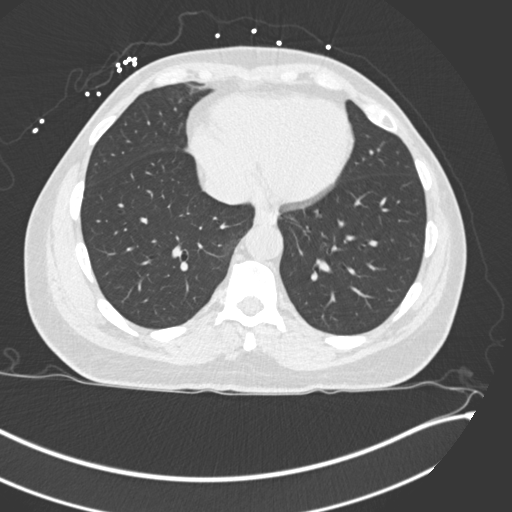
[im 58/158  lung]
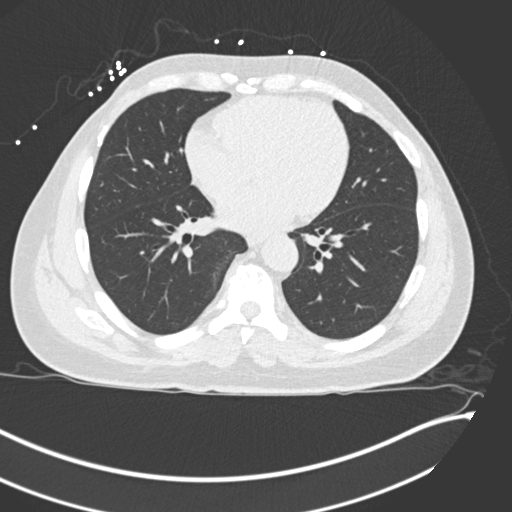
[im 72/158  mediastinal]
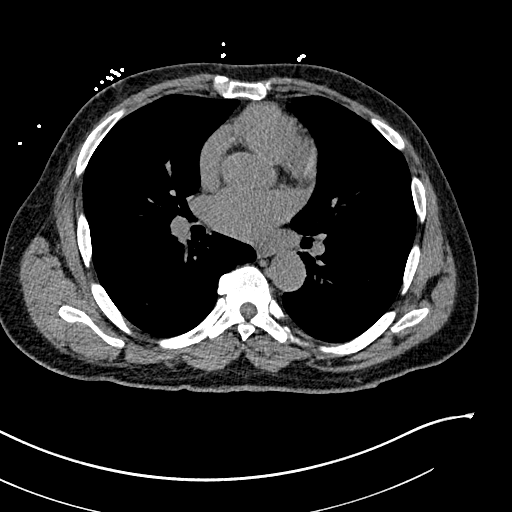
[im 72/158  lung]
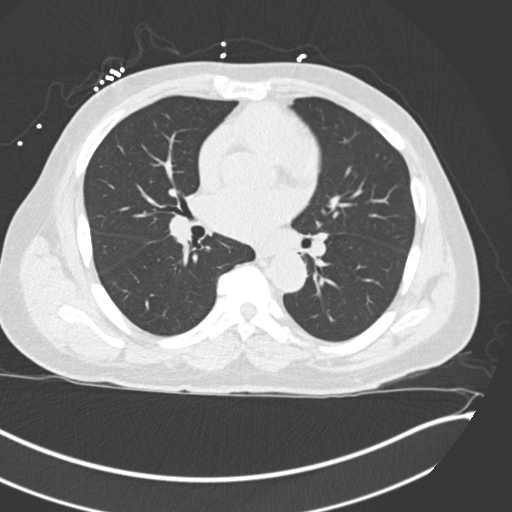
[im 86/158  lung]
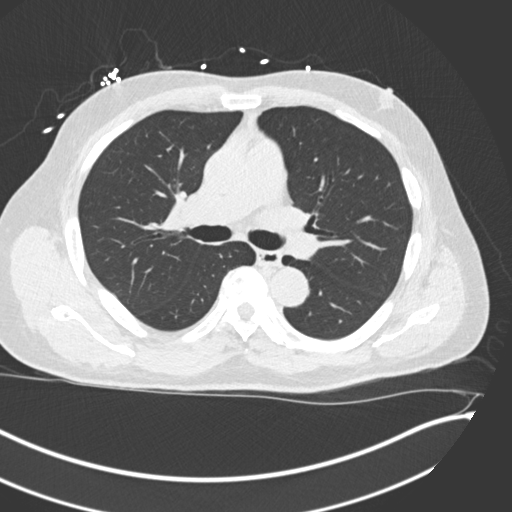
[im 100/158  lung]
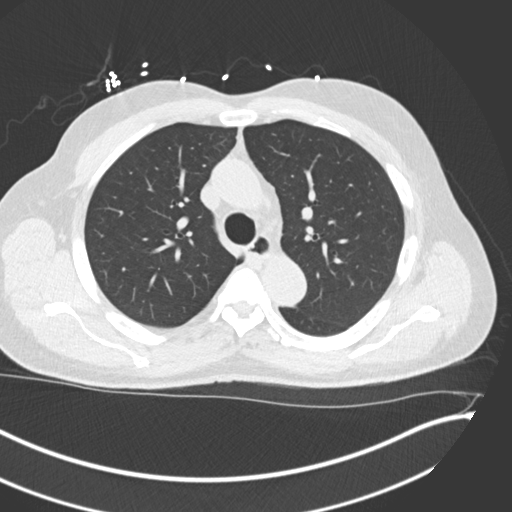
[im 115/158  lung]
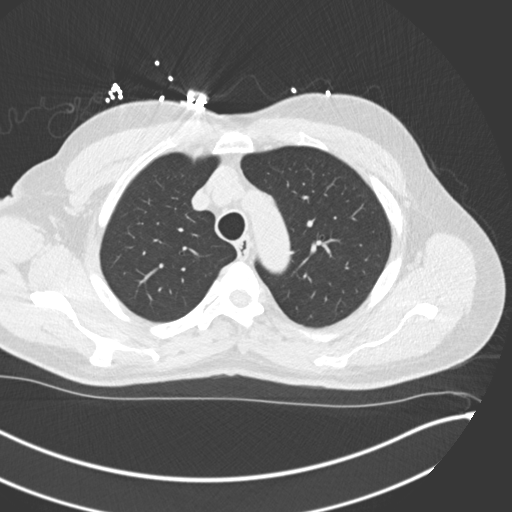
[im 129/158  mediastinal]
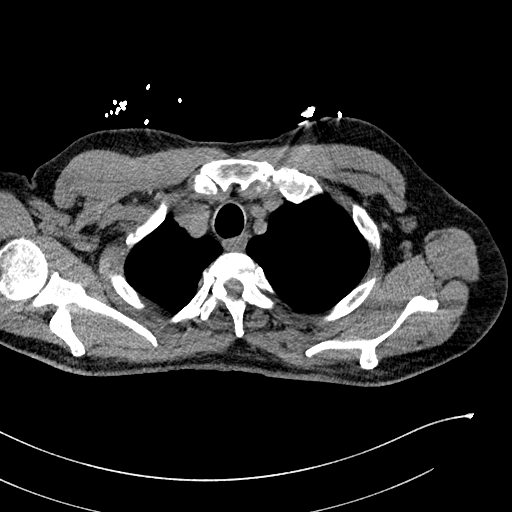
[im 129/158  lung]
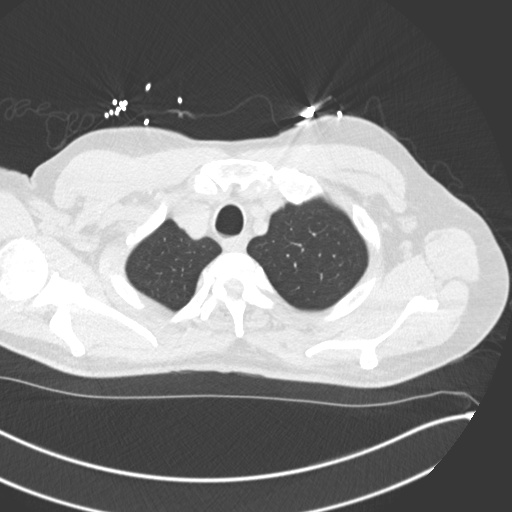
[im 143/158  lung]
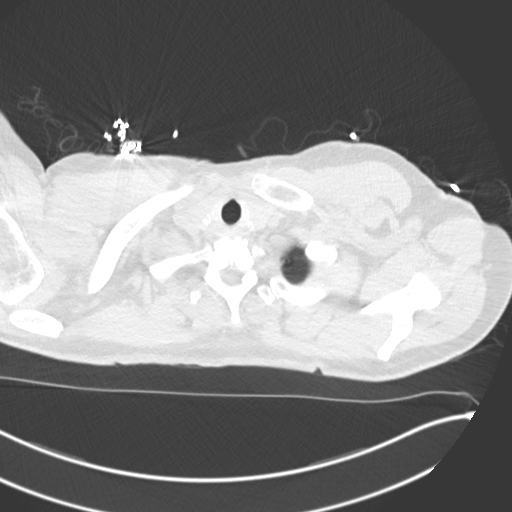

[Series 6: coronal · coronal · 0.64mm/px · 3 of 100 slices shown]
[im 20/100  lung]
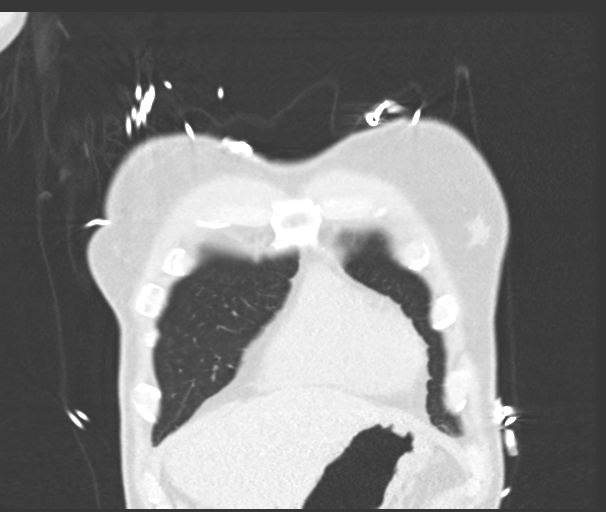
[im 40/100  lung]
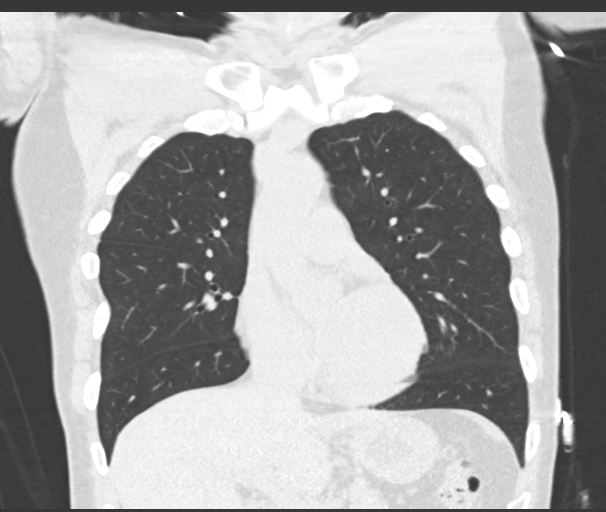
[im 60/100  lung]
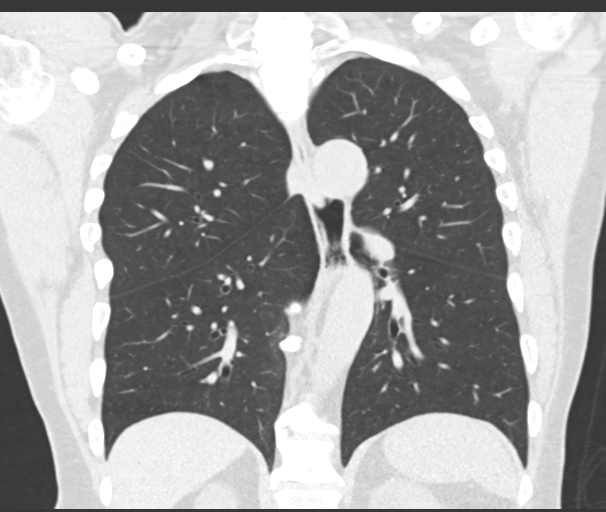

[13 of 36 positions shown; findings below may reference images not displayed]

FINDINGS: Cardiovascular: Normal heart size. No significant pericardial
effusion/thickening. Minimally atherosclerotic nonaneurysmal
thoracic aorta. Normal caliber pulmonary arteries.

Mediastinum/Nodes: No discrete thyroid nodules. Unremarkable
esophagus. No pathologically enlarged axillary, mediastinal or hilar
lymph nodes, noting limited sensitivity for the detection of hilar
adenopathy on this noncontrast study.

Lungs/Pleura: No pneumothorax. No pleural effusion. No acute
consolidative airspace disease or lung masses. Two tiny right lower
lobe solid pulmonary nodules, largest 3 mm peripherally (series
8/image 117). No additional significant pulmonary nodules.

Upper abdomen: Simple 2.1 cm medial upper left renal cyst.

Musculoskeletal: No aggressive appearing focal osseous lesions.
Moderate thoracic spondylosis.
IMPRESSION: 1. Two tiny solid right lower lobe pulmonary nodules, largest 3 mm,
very likely benign. No follow-up needed if patient is low-risk (and
has no known or suspected primary neoplasm). Non-contrast chest CT
can be considered in 12 months if patient is high-risk. This
recommendation follows the consensus statement: Guidelines for
Management of Incidental Pulmonary Nodules Detected on CT Images:
2. Aortic Atherosclerosis ([id number]).

## 2022-03-19 IMAGING — CR DG CHEST 2V
2 series · 2 of 2 positions shown · non-contrast
Comparison: Portable chest 10/01/2016.

CLINICAL DATA: 69-year-old male with right side chest pain
radiating to the back since yesterday.

EXAM:
CHEST - 2 VIEW

[chest pa]
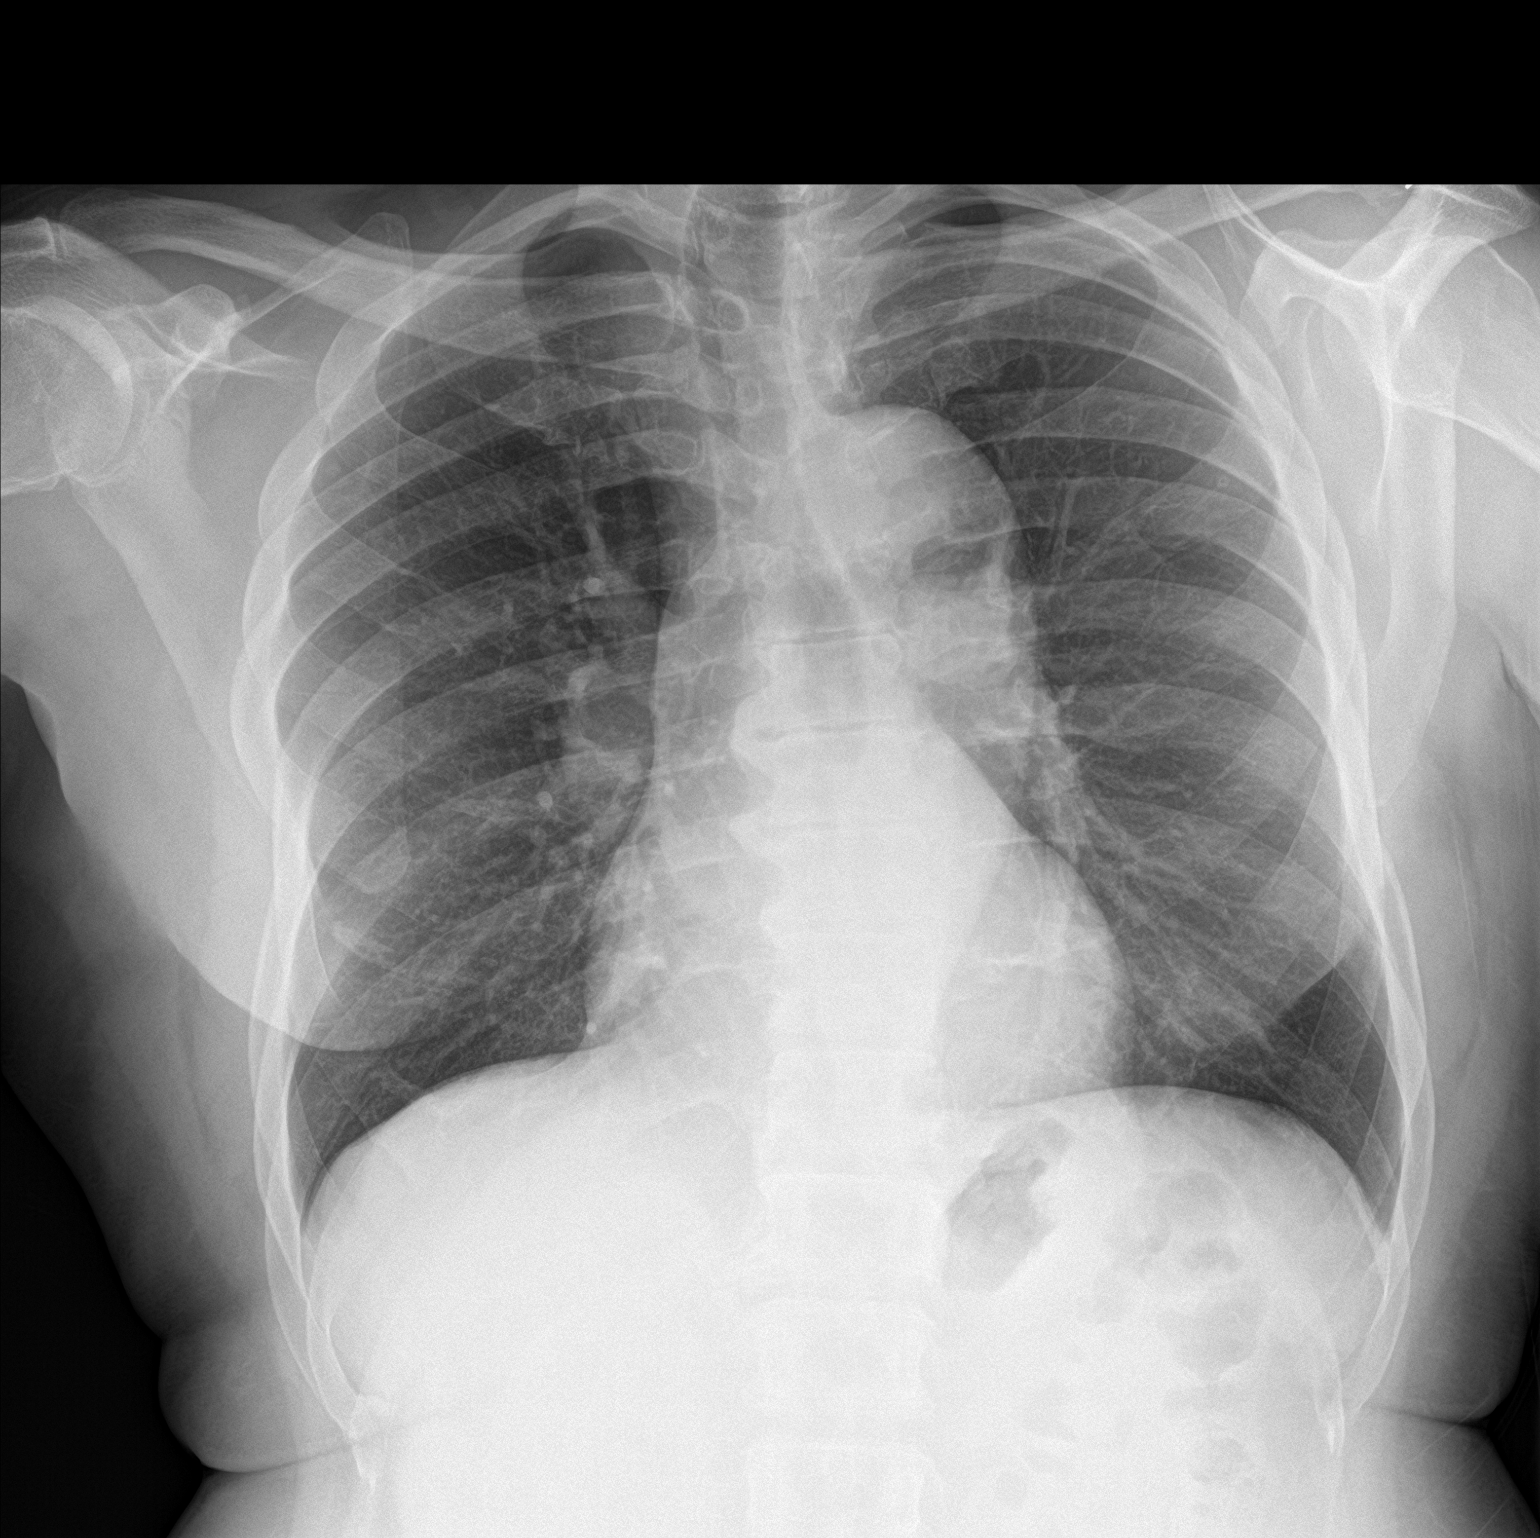

[chest lat]
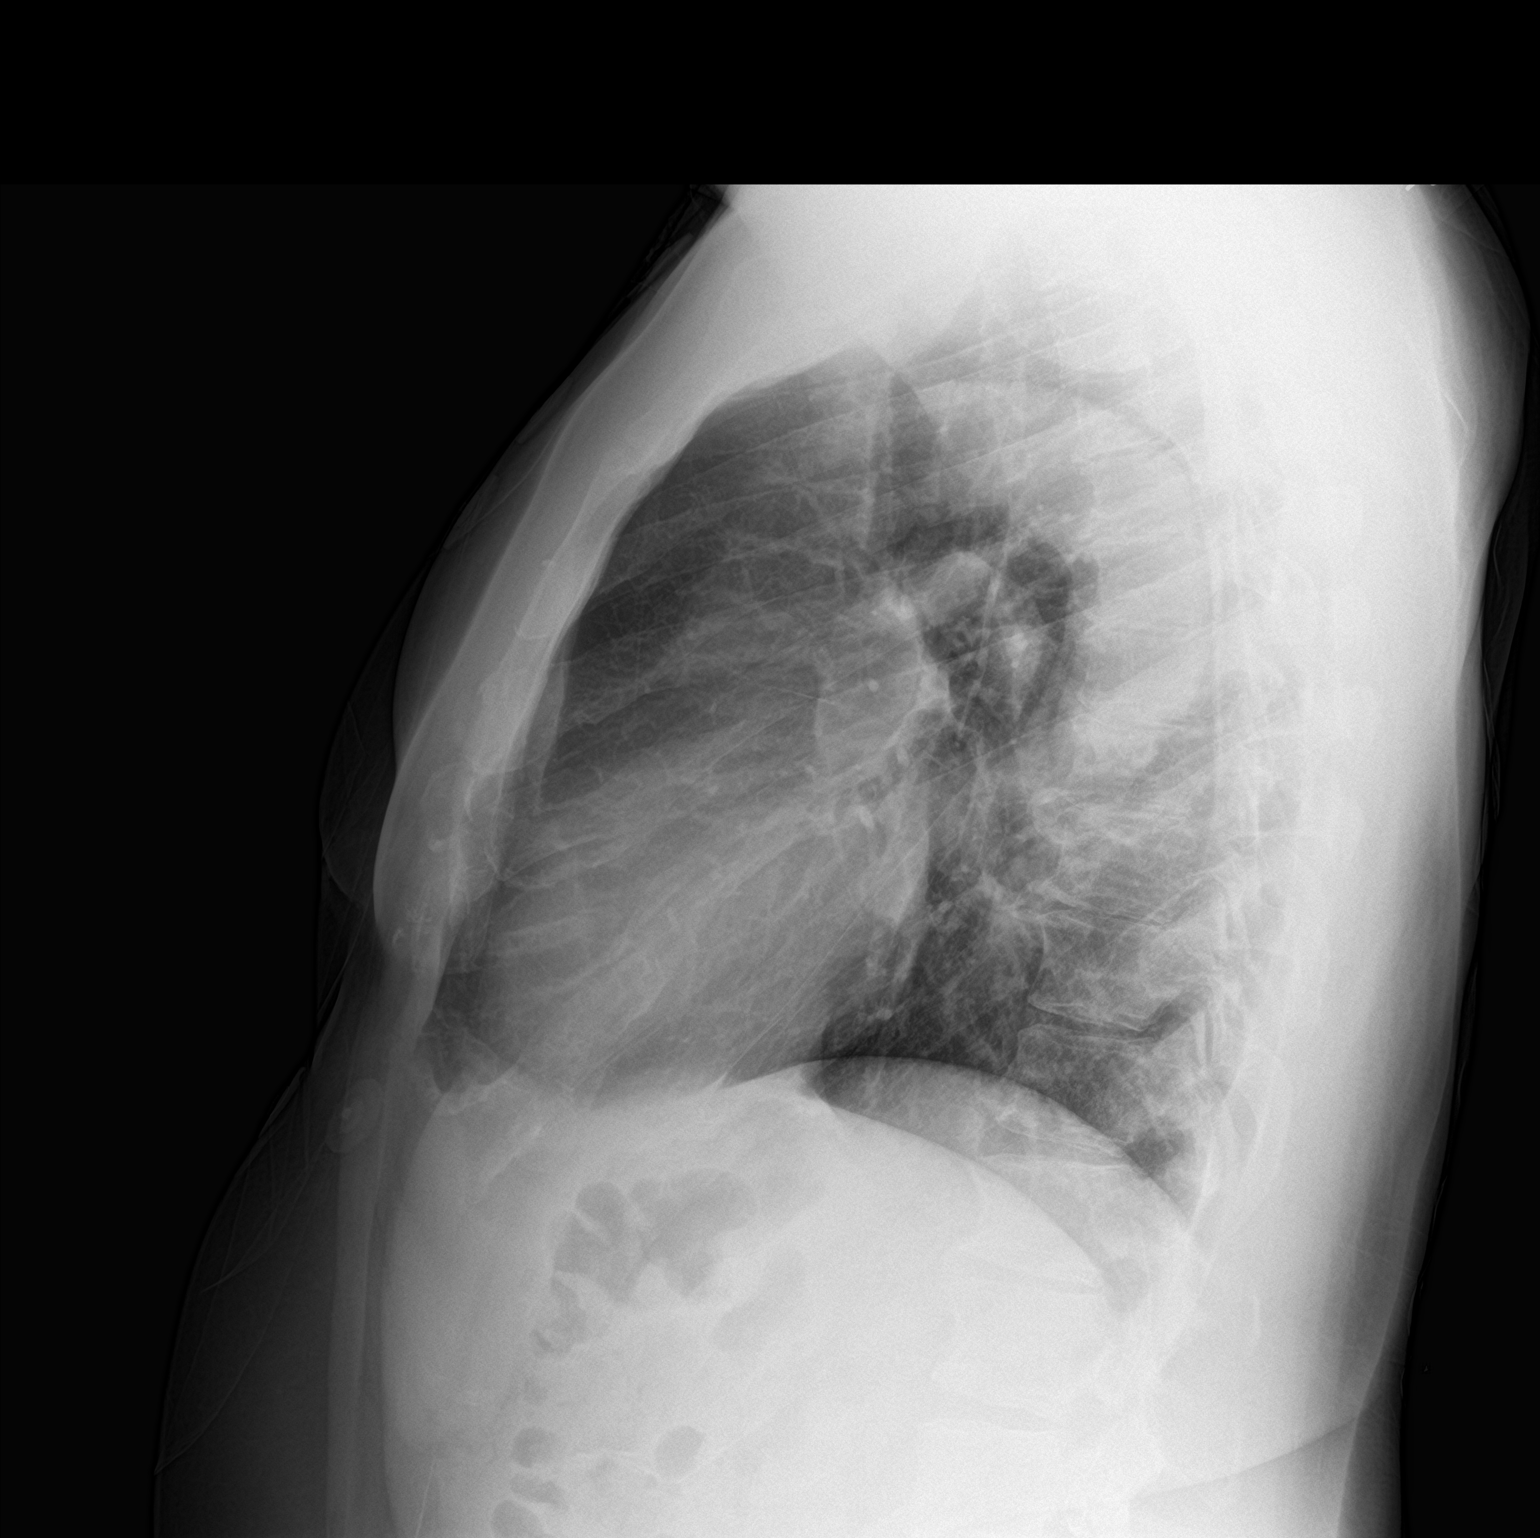

[2 of 2 positions shown; findings below may reference images not displayed]

FINDINGS: PA and lateral views. Tortuous thoracic aorta, not significantly
changed from 9024. Other mediastinal contours are within normal
limits. Visualized tracheal air column is within normal limits. Lung
volumes are normal. No pneumothorax, pulmonary edema, pleural
effusion or consolidation. Subtle asymmetric increased interstitial
markings about the superior right hilum.

Mild scoliosis. No acute osseous abnormality identified. Negative
visible bowel gas pattern.
IMPRESSION: 1. Subtle asymmetric increased interstitial markings about the
superior right hilum new since 9024. This might be postinflammatory
scarring. Acute infection or lung nodule not excluded.
2. Chronic tortuosity of the thoracic aorta is stable. Query chronic
hypertension.
3. No other acute cardiopulmonary abnormality.
